# Patient Record
Sex: Male | Born: 1970 | Race: White | Hispanic: No | Marital: Married | Smoking: Never smoker
Health system: Southern US, Community
[De-identification: ages and names within clinical notes are randomized; demographics above are authoritative.]

## PROBLEM LIST (undated history)

## (undated) HISTORY — PX: MICRODISCECTOMY LUMBAR: SUR864

---

## 2016-08-12 ENCOUNTER — Emergency Department (HOSPITAL_COMMUNITY): Payer: BLUE CROSS/BLUE SHIELD

## 2016-08-12 ENCOUNTER — Encounter (HOSPITAL_COMMUNITY): Payer: Self-pay

## 2016-08-12 ENCOUNTER — Emergency Department (HOSPITAL_COMMUNITY)
Admission: EM | Admit: 2016-08-12 | Discharge: 2016-08-12 | Disposition: A | Discharge status: Home / Self Care | Payer: BLUE CROSS/BLUE SHIELD | Attending: Emergency Medicine | Admitting: Emergency Medicine

## 2016-08-12 DIAGNOSIS — R29810 Facial weakness: Secondary | ICD-10-CM

## 2016-08-12 DIAGNOSIS — R531 Weakness: Secondary | ICD-10-CM | POA: Diagnosis present

## 2016-08-12 DIAGNOSIS — Z5181 Encounter for therapeutic drug level monitoring: Secondary | ICD-10-CM | POA: Insufficient documentation

## 2016-08-12 DIAGNOSIS — G510 Bell's palsy: Secondary | ICD-10-CM | POA: Diagnosis not present

## 2016-08-12 LAB — I-STAT TROPONIN, ED: TROPONIN I, POC: 0 ng/mL (ref 0.00–0.08)

## 2016-08-12 LAB — I-STAT CHEM 8, ED
BUN: 23 mg/dL — ABNORMAL HIGH (ref 6–20)
CHLORIDE: 104 mmol/L (ref 101–111)
Calcium, Ion: 1.17 mmol/L (ref 1.13–1.30)
Creatinine, Ser: 0.9 mg/dL (ref 0.61–1.24)
GLUCOSE: 108 mg/dL — AB (ref 65–99)
HEMATOCRIT: 48 % (ref 39.0–52.0)
HEMOGLOBIN: 16.3 g/dL (ref 13.0–17.0)
POTASSIUM: 3.9 mmol/L (ref 3.5–5.1)
Sodium: 141 mmol/L (ref 135–145)
TCO2: 25 mmol/L (ref 0–100)

## 2016-08-12 LAB — CBC
HEMATOCRIT: 47.7 % (ref 39.0–52.0)
Hemoglobin: 16.3 g/dL (ref 13.0–17.0)
MCH: 29 pg (ref 26.0–34.0)
MCHC: 34.2 g/dL (ref 30.0–36.0)
MCV: 84.9 fL (ref 78.0–100.0)
PLATELETS: 229 10*3/uL (ref 150–400)
RBC: 5.62 MIL/uL (ref 4.22–5.81)
RDW: 13.1 % (ref 11.5–15.5)
WBC: 6.8 10*3/uL (ref 4.0–10.5)

## 2016-08-12 LAB — ETHANOL

## 2016-08-12 LAB — COMPREHENSIVE METABOLIC PANEL
ALBUMIN: 4.4 g/dL (ref 3.5–5.0)
ALT: 54 U/L (ref 17–63)
AST: 40 U/L (ref 15–41)
Alkaline Phosphatase: 73 U/L (ref 38–126)
Anion gap: 8 (ref 5–15)
BUN: 19 mg/dL (ref 6–20)
CHLORIDE: 106 mmol/L (ref 101–111)
CO2: 24 mmol/L (ref 22–32)
CREATININE: 1.03 mg/dL (ref 0.61–1.24)
Calcium: 9.8 mg/dL (ref 8.9–10.3)
GFR calc Af Amer: >60 mL/min (ref >60)
Glucose, Bld: 109 mg/dL — ABNORMAL HIGH (ref 65–99)
POTASSIUM: 4 mmol/L (ref 3.5–5.1)
SODIUM: 138 mmol/L (ref 135–145)
Total Bilirubin: 0.7 mg/dL (ref 0.3–1.2)
Total Protein: 7.6 g/dL (ref 6.5–8.1)

## 2016-08-12 LAB — URINALYSIS, ROUTINE W REFLEX MICROSCOPIC
BILIRUBIN URINE: NEGATIVE
GLUCOSE, UA: 250 mg/dL — AB
HGB URINE DIPSTICK: NEGATIVE
Ketones, ur: NEGATIVE mg/dL
Leukocytes, UA: NEGATIVE
Nitrite: NEGATIVE
Protein, ur: NEGATIVE mg/dL
SPECIFIC GRAVITY, URINE: 1.031 — AB (ref 1.005–1.030)
pH: 5.5 (ref 5.0–8.0)

## 2016-08-12 LAB — PROTIME-INR
INR: 0.95
PROTHROMBIN TIME: 12.7 s (ref 11.4–15.2)

## 2016-08-12 LAB — RAPID URINE DRUG SCREEN, HOSP PERFORMED
AMPHETAMINES: NOT DETECTED
BARBITURATES: NOT DETECTED
BENZODIAZEPINES: NOT DETECTED
COCAINE: NOT DETECTED
Opiates: NOT DETECTED
Tetrahydrocannabinol: NOT DETECTED

## 2016-08-12 LAB — DIFFERENTIAL
BASOS ABS: 0 10*3/uL (ref 0.0–0.1)
BASOS PCT: 0 %
EOS ABS: 0.1 10*3/uL (ref 0.0–0.7)
Eosinophils Relative: 2 %
Lymphocytes Relative: 36 %
Lymphs Abs: 2.5 10*3/uL (ref 0.7–4.0)
Monocytes Absolute: 0.5 10*3/uL (ref 0.1–1.0)
Monocytes Relative: 8 %
NEUTROS ABS: 3.7 10*3/uL (ref 1.7–7.7)
NEUTROS PCT: 54 %

## 2016-08-12 LAB — APTT: APTT: 27 s (ref 24–36)

## 2016-08-12 LAB — CBG MONITORING, ED: GLUCOSE-CAPILLARY: 108 mg/dL — AB (ref 65–99)

## 2016-08-12 MED ORDER — PREDNISONE 20 MG PO TABS: 60.0000 mg | ORAL_TABLET | Freq: Every day | ORAL | 0 refills | Status: AC

## 2016-08-12 NOTE — Consult Note (Signed)
Neurology Consultation Reason for Consult: Concern for stroke Referring Physician: Abran DukeZavitz, J  CC: Right facial weakness  History is obtained from: Patient  HPI: Tanya NonesJeremy Tantillo is a 45 y.o. male who was in his normal state of health this morning, then while he was in line to get some food this morning he noticed that his right eye seemed "like a film was in front of the like. In your eyes underwater." He subsequently noticed some tingling on the right side of his face and at work his coworkers noticed that the right-sided his face was not working as well and therefore EMS was called.   LKW: 7:45 AM tpa given?: no, mild symptoms    ROS: A 14 point ROS was performed and is negative except as noted in the HPI.   History reviewed. No pertinent past medical history.   Family history: No history of similar  Social History:  reports that he has never smoked. He has never used smokeless tobacco. He reports that he does not drink alcohol or use drugs.   Exam: Current vital signs: BP 124/81   Pulse 82   Temp 98.6 F (37 C)   Resp 14   Ht 5\' 11"  (1.803 m)   Wt 101.8 kg (224 lb 6.9 oz)   SpO2 99%   BMI 31.30 kg/m  Vital signs in last 24 hours: Temp:  [98.6 F (37 C)] 98.6 F (37 C) (08/15 0903) Pulse Rate:  [82-88] 82 (08/15 1000) Resp:  [14-25] 14 (08/15 1000) BP: (117-145)/(81-93) 124/81 (08/15 1000) SpO2:  [96 %-99 %] 99 % (08/15 1000) Weight:  [101.8 kg (224 lb 6.9 oz)] 101.8 kg (224 lb 6.9 oz) (08/15 0857)   Physical Exam  Constitutional: Appears well-developed and well-nourished.  Psych: Affect appropriate to situation Eyes: No scleral injection HENT: No OP obstrucion Head: Normocephalic.  Cardiovascular: Normal rate and regular rhythm.  Respiratory: Effort normal and breath sounds normal to anterior ascultation GI: Soft.  No distension. There is no tenderness.  Skin: WDI  Neuro: Mental Status: Patient is awake, alert, oriented to person, place, month, year, and  situation. Patient is able to give a clear and coherent history. No signs of aphasia or neglect Cranial Nerves: II: Visual Fields are full. Pupils are equal, round, and reactive to light.   III,IV, VI: EOMI without ptosis or diploplia.  V: Facial sensation is symmetric to pinprick and touch VII: Facial movement is notable for weakness in both the upper and lower face on the right VIII: hearing is intact to voice X: Uvula elevates symmetrically XI: Shoulder shrug is symmetric. XII: tongue is midline without atrophy or fasciculations.  Motor: Tone is normal. Bulk is normal. 5/5 strength was present in all four extremities.  Sensory: Sensation is symmetric to light touch and temperature in the arms and legs. Cerebellar: FNF and HKS are intact bilaterally   I have reviewed labs in epic and the results pertinent to this consultation are: Chem 8-negative  I have reviewed the images obtained: CT head-negative  Impression: 45 year old male with new onset right facial weakness. His visual changes and tingling are concerning, but without objective sensory deficit for visual deficit I do wonder if he had decreased lacrimation from his right eye and perceived the weakness as an abnormal sensation as well. I think it is possible that this represents Bell's palsy, but given his other complaints I do think MRI is necessary.  Recommendations: 1) MRI brain, if negative then would treat as Bell's palsy with  steroids   Ritta SlotMcNeill Zenaida Tesar, MD [facility] [phone]  If 7pm- 7am, please page neurology on call as listed in [facility].

## 2016-08-12 NOTE — Code Documentation (Signed)
45[facility] at [id number].  Patient with sudden onset blurred vision in the right eye and right facial tingling at 0745.  Code stroke called by EMS.  Stroke team at the bedside on patient arrival.  Labs drawn and patient to CT with team.  CT completed.  NIHSS 3, see documentation for details and code stroke times.  Patient with right facial droop with right eye involvement.  Suspect Bell's palsy per Dr Amada JupiterKirkpatrick.  Patient is too mild to treat with tPA at this time, however, remains in the window to treat with tPA until 1215 should symptoms worsen.  Patient to have MRI.  Bedside handoff with ED RN Nehemiah SettleBrooke.

## 2016-08-12 NOTE — ED Triage Notes (Addendum)
Per EMS - pt intermittent h/a and vision issues x 2-3 days. Pt sudden right vision loss and blurred vision, generalized weakness, right-sided facial droop today while going through drive thru. Equal grip strength. No sensory deficits. No speech changes.

## 2016-08-12 NOTE — ED Provider Notes (Signed)
[facility]PT Provider Note   CSN: [id number] Arrival date & time: 08/12/16  [id number]     History   Chief Complaint Chief Complaint  Patient presents with  . Code Stroke    HPI Jacob NonesJeremy Bostwick is a 45 y.o. male.  The history is provided by the patient and the EMS personnel. No language interpreter was used.  Weakness  This is a new problem. The current episode started 1 to 2 hours ago. The problem occurs constantly. The problem has not changed since onset.Pertinent negatives include no chest pain, no abdominal pain, no headaches and no shortness of breath. Nothing aggravates the symptoms. Nothing relieves the symptoms. He has tried nothing for the symptoms. The treatment provided no relief.    History reviewed. No pertinent past medical history.  There are no active problems to display for this patient.   Past Surgical History:  Procedure Laterality Date  . MICRODISCECTOMY LUMBAR     L4-5    OB History    No data available       Home Medications    Prior to Admission medications   Medication Sig Start Date End Date Taking? Authorizing Provider  naproxen sodium (ANAPROX) 220 MG tablet Take 440 mg by mouth as needed (for pain or headache).   Yes Historical Provider, MD  predniSONE (DELTASONE) 20 MG tablet Take 3 tablets (60 mg total) by mouth daily. 08/12/16 08/19/16  Dan HumphreysMichael Marielle Mantione, MD    Family History No family history on file.  Social History Social History  Substance Use Topics  . Smoking status: Never Smoker  . Smokeless tobacco: Never Used  . Alcohol use No     Allergies   Review of patient's allergies indicates no known allergies.   Review of Systems Review of Systems  Constitutional: Negative for chills and fever.  HENT: Negative for ear pain and sore throat.   Eyes: Negative for pain and visual disturbance.  Respiratory: Negative for cough and shortness of breath.   Cardiovascular: Negative for chest pain and palpitations.  Gastrointestinal:  Negative for abdominal pain and vomiting.  Genitourinary: Negative for dysuria and hematuria.  Musculoskeletal: Negative for arthralgias and back pain.  Skin: Negative for color change and rash.  Neurological: Positive for facial asymmetry, speech difficulty, weakness and numbness. Negative for seizures, syncope and headaches.  All other systems reviewed and are negative.    Physical Exam Updated Vital Signs BP 124/89   Pulse 84   Temp 98.6 F (37 C)   Resp 18   Ht 5\' 11"  (1.803 m)   Wt 101.8 kg   SpO2 98%   BMI 31.30 kg/m   Physical Exam  Constitutional: He appears well-developed and well-nourished.  HENT:  Head: Normocephalic and atraumatic.  Eyes: Conjunctivae are normal.  Neck: Neck supple.  Cardiovascular: Normal rate and regular rhythm.   No murmur heard. Pulmonary/Chest: Effort normal and breath sounds normal. No respiratory distress.  Abdominal: Soft. There is no tenderness.  Musculoskeletal: He exhibits no edema.  Neurological: He is alert. He has normal strength. A cranial nerve deficit is present. No sensory deficit. He displays a negative Romberg sign. Coordination and gait normal. GCS eye subscore is 4. GCS verbal subscore is 5. GCS motor subscore is 6.  Patient with right-sided facial droop, weakness of right eyelid. Patient with no sensory deficits on exam. He has otherwise normal neurologic exam.  Skin: Skin is warm and dry.  Psychiatric: He has a normal mood and affect.  Nursing note and vitals  reviewed.   ED Treatments / Results  Labs (all labs ordered are listed, but only abnormal results are displayed) Labs Reviewed  COMPREHENSIVE METABOLIC PANEL - Abnormal; Notable for the following:       Result Value   Glucose, Bld 109 (*)    All other components within normal limits  URINALYSIS, ROUTINE W REFLEX MICROSCOPIC (NOT AT [facility]) - Abnormal; Notable for the following:    Specific Gravity, Urine 1.031 (*)    Glucose, UA 250 (*)    All other components  within normal limits  I-STAT CHEM 8, ED - Abnormal; Notable for the following:    BUN 23 (*)    Glucose, Bld 108 (*)    All other components within normal limits  CBG MONITORING, ED - Abnormal; Notable for the following:    Glucose-Capillary 108 (*)    All other components within normal limits  ETHANOL  PROTIME-INR  APTT  CBC  DIFFERENTIAL  URINE RAPID DRUG SCREEN, HOSP PERFORMED  I-STAT TROPOININ, ED    EKG  EKG Interpretation None       Radiology Mr Brain Wo Contrast  Result Date: 08/12/2016 CLINICAL DATA:  Right eye visual disturbance this morning. Tingling of the right side of the face. EXAM: MRI HEAD WITHOUT CONTRAST TECHNIQUE: Multiplanar, multiecho pulse sequences of the brain and surrounding structures were obtained without intravenous contrast. COMPARISON:  Head CT earlier same day. FINDINGS: The brain has a normal appearance on all pulse sequences without evidence of malformation, atrophy, old or acute infarction, mass lesion, hemorrhage, hydrocephalus or extra-axial collection. No pituitary mass. No fluid in the sinuses, middle ears or mastoids. No skull or skullbase lesion. There is flow in the major vessels at the base of the brain. Major venous sinuses show flow. IMPRESSION: Normal examination. No cause of the presenting symptoms is identified. Electronically Signed   By: Paulina FusiMark  Shogry M.D.   On: 08/12/2016 12:36   Ct Head Code Stroke W/o Cm  Result Date: 08/12/2016 CLINICAL DATA:  Code stroke.  Right-sided facial paralysis. EXAM: CT HEAD WITHOUT CONTRAST TECHNIQUE: Contiguous axial images were obtained from the base of the skull through the vertex without intravenous contrast. COMPARISON:  None. FINDINGS: Insert my normal head ASPECTS (Alberta Stroke Program Early CT Score) - Ganglionic level infarction (caudate, lentiform nuclei, internal capsule, insula, M1-M3 cortex): 7 - Supraganglionic infarction (M4-M6 cortex): 3 Total score (0-10 with 10 being normal): 10  IMPRESSION: 1. Normal head CT 2. ASPECTS is 10 These results were called by telephone at the time of interpretation on 08/12/2016 at 8:48 am to Dr. Amada JupiterKirkpatrick, who verbally acknowledged these results. Electronically Signed   By: Paulina FusiMark  Shogry M.D.   On: 08/12/2016 08:49    Procedures Procedures (including critical care time)  Medications Ordered in ED Medications - No data to display   Initial Impression / Assessment and Plan / ED Course  I have reviewed the triage vital signs and the nursing notes.  Pertinent labs & imaging results that were available during my care of the patient were reviewed by me and considered in my medical decision making (see chart for details).  Clinical Course   Patient presents for right facial droop that began at roughly 0715. He endorses tingling in his right cheek and mouth. Patient made code stroke prehospital. Blood sugar 136, vital signs stable. He was taken to CT scanner, seen immediately by neurology. CT head without acute findings.  Patient with continued right facial droop encompassing his right mouth, cheek, forehead.  Likely Bell's palsy however patient with tingling sensation. Will obtain MRI brain to rule out stroke.  UDS negative, UA negative for UTI, blood sugar 108, i-STAT chemistry unremarkable and troponin negative.  Physical exam reveals no rashes to the patient's body. No recent tick bites.  MRI results,No acute intracranial findings. Patient reevaluated. No extension of symptoms. Patient urged to use prednisone 7 days, artificial tears as needed he is encouraged to follow-up with his primary care physician in 1 week for reevaluation.  Discussed case with my attending, Dr. Jodi Mourning.    Final Clinical Impressions(s) / ED Diagnoses   Final diagnoses:  Bell's palsy    New Prescriptions New Prescriptions   PREDNISONE (DELTASONE) 20 MG TABLET    Take 3 tablets (60 mg total) by mouth daily.     Dan Humphreys, MD 08/12/16 1258      Blane Ohara, MD 08/12/16 [phone]

## 2016-08-12 NOTE — ED Notes (Addendum)
Pt transported to MRI 

## 2017-03-23 IMAGING — CT CT HEAD CODE STROKE
3 of 4 series · 17 of 47 positions shown, 20 images · non-contrast
Comparison: None.

CLINICAL DATA: Code stroke.  Right-sided facial paralysis.

EXAM:
CT HEAD WITHOUT CONTRAST
TECHNIQUE: Contiguous axial images were obtained from the base of the skull
through the vertex without intravenous contrast.

[Series 201: head w/o, idose (1) · axial · non-contrast · 0.49mm/px · z∈[+136,+271]mm · 11 of 33 slices shown, 14 images]
[im 3/33  brain]
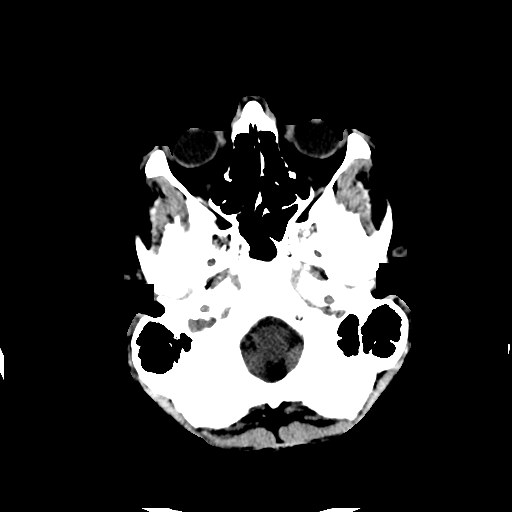
[im 3/33  bone]
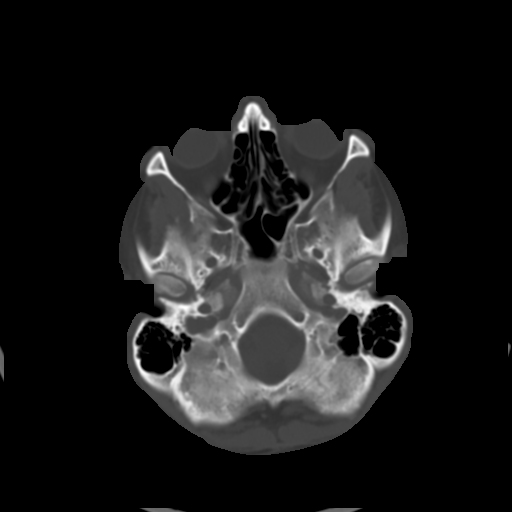
[im 5/33  brain]
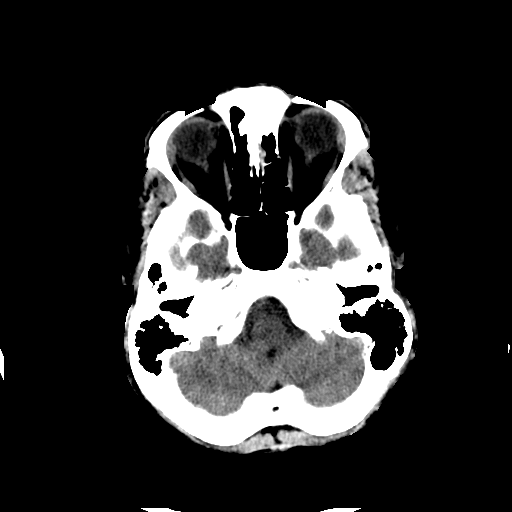
[im 7/33  brain]
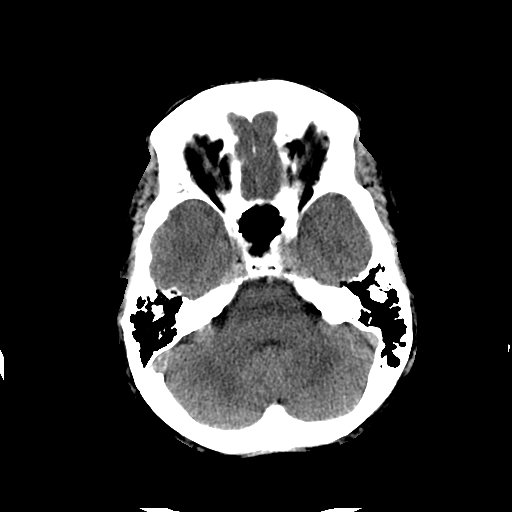
[im 12/33  brain]
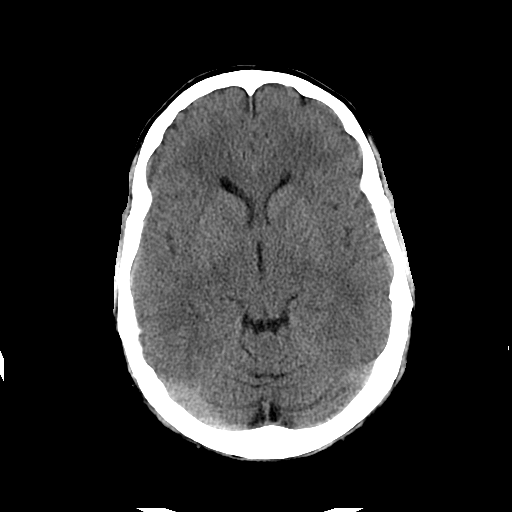
[im 14/33  brain]
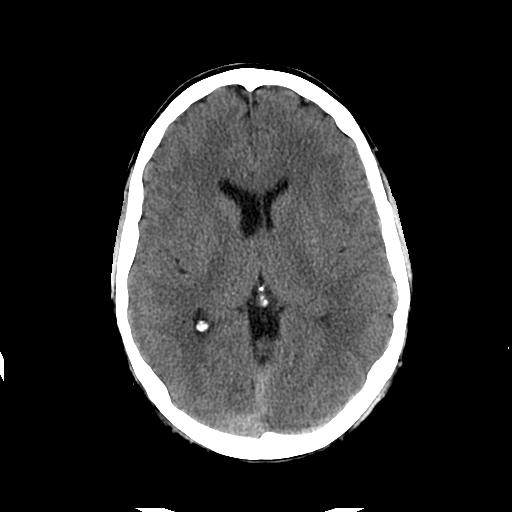
[im 14/33  bone]
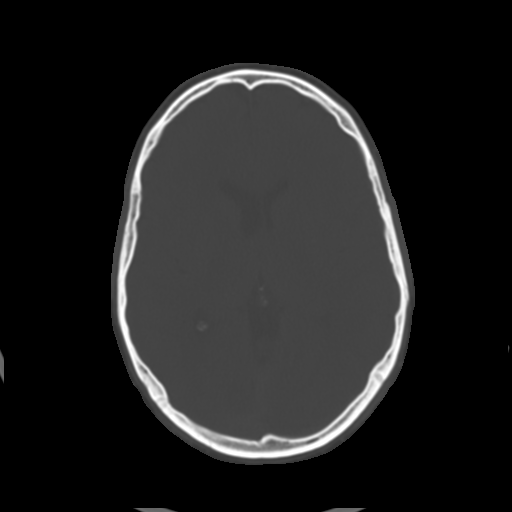
[im 17/33  brain]
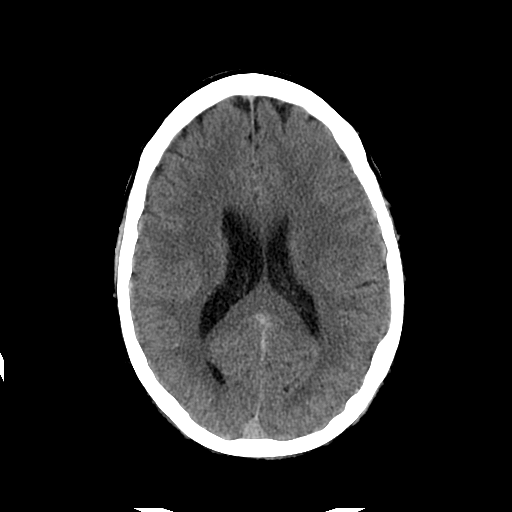
[im 19/33  brain]
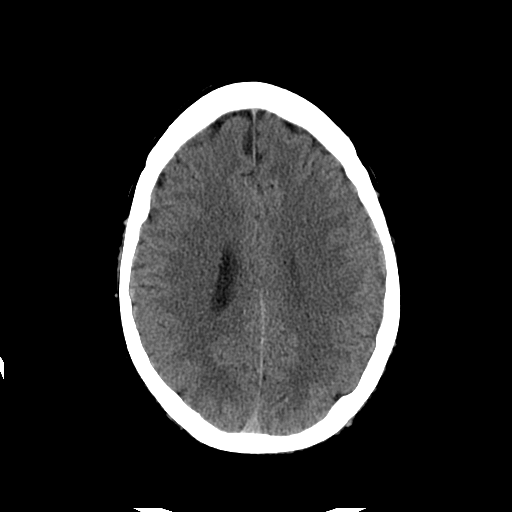
[im 21/33  brain]
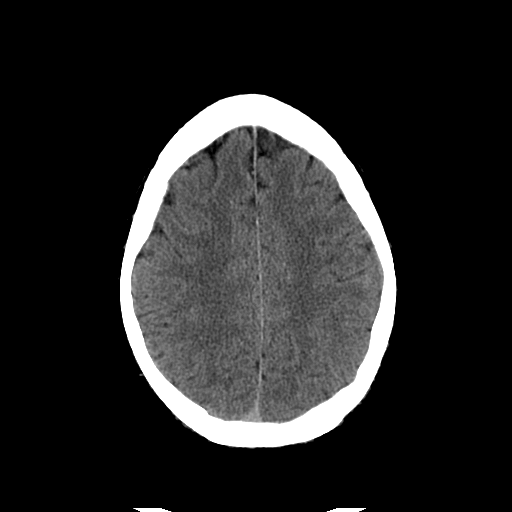
[im 26/33  brain]
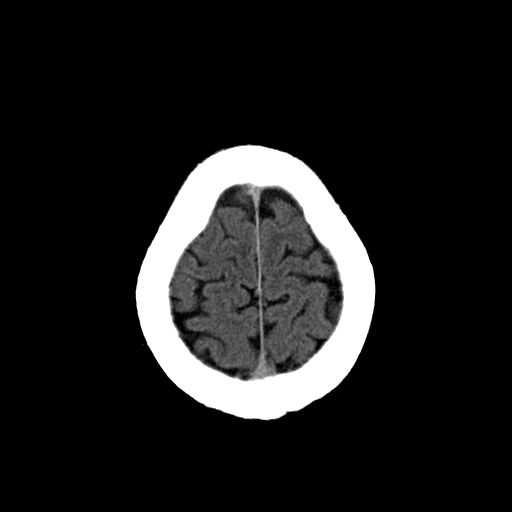
[im 26/33  bone]
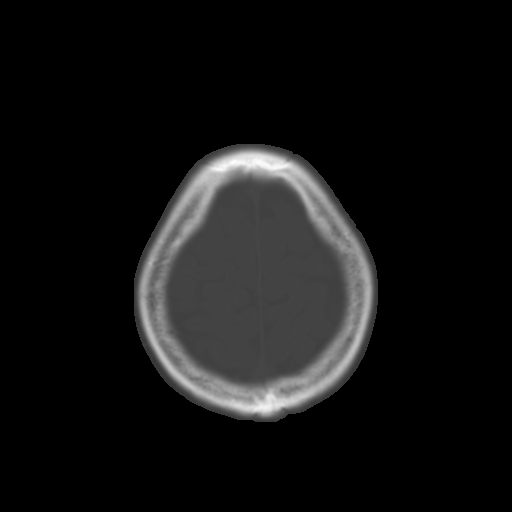
[im 28/33  brain]
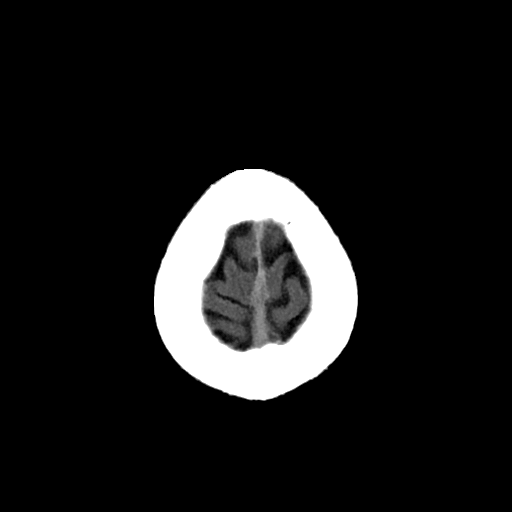
[im 30/33  brain]
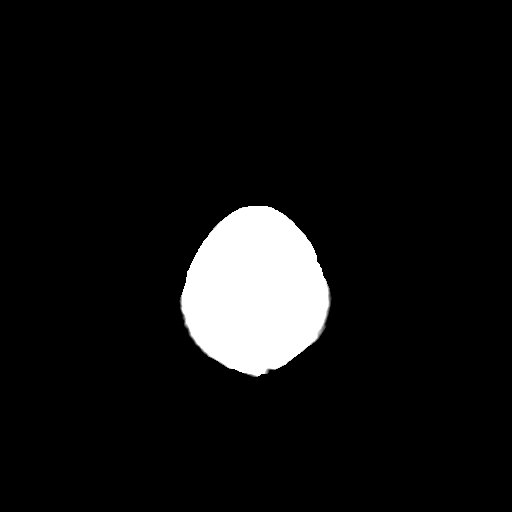

[Series 203: coronal st, idose (1) · coronal · 0.40mm/px · 3 of 73 slices shown]
[im 25/73  brain]
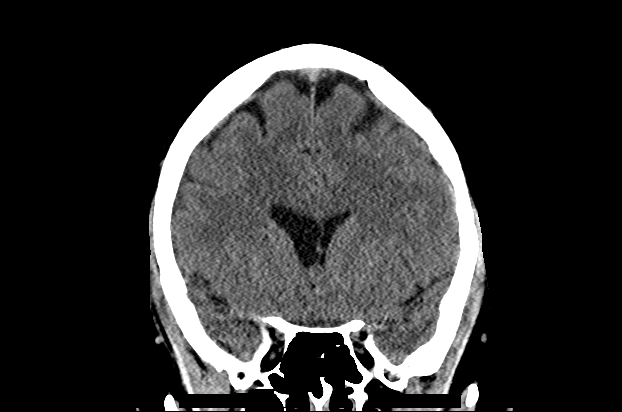
[im 33/73  brain]
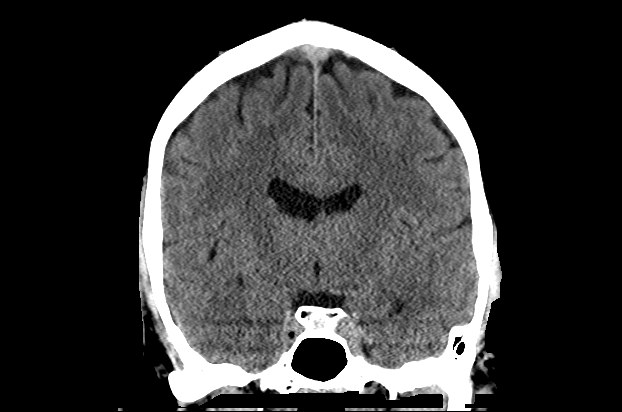
[im 41/73  brain]
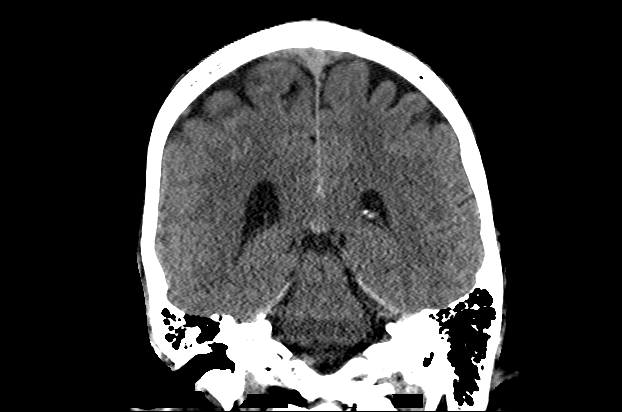

[Series 204: sagittal st, idose (1) · sagittal · 0.40mm/px · 3 of 83 slices shown]
[im 28/83  brain]
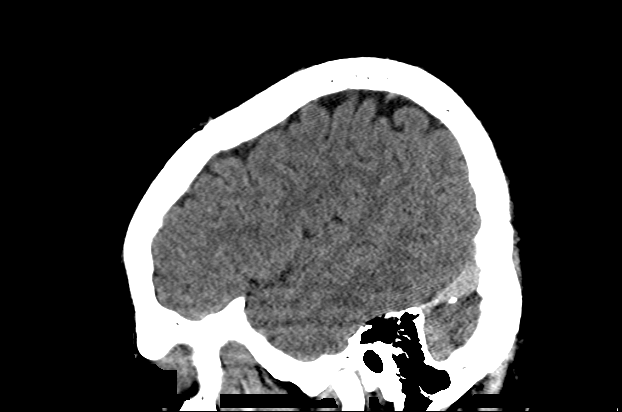
[im 42/83  brain]
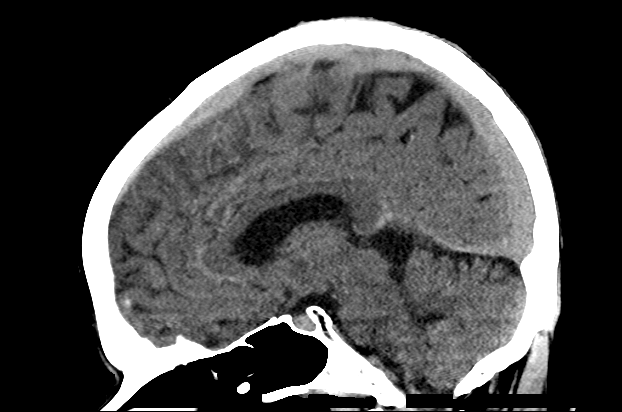
[im 55/83  brain]
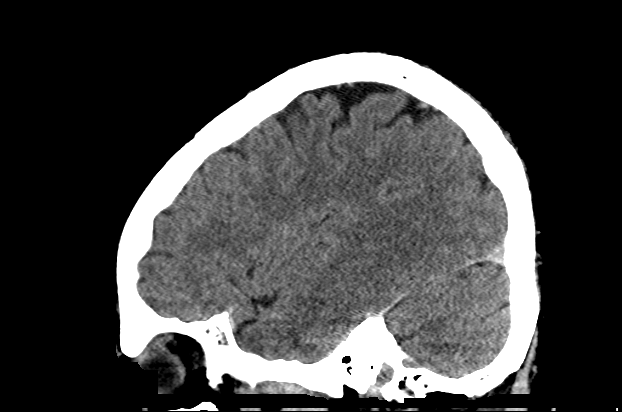

[17 of 47 positions shown; findings below may reference images not displayed]

FINDINGS: Insert my normal head

ASPECTS (Alberta Stroke Program Early CT Score)

- Ganglionic level infarction (caudate, lentiform nuclei, internal
capsule, insula, M1-M3 cortex): 7

- Supraganglionic infarction (M4-M6 cortex): 3

Total score (0-10 with 10 being normal): 10
IMPRESSION: 1. Normal head CT
2. ASPECTS is 10

These results were called by telephone at the time of interpretation
on 08/12/2016 at [DATE] to Dr. Fodor, who verbally
acknowledged these results.
